# Patient Record
Sex: Female | Born: 1965 | Race: Black or African American | Hispanic: No | Marital: Married | Smoking: Never smoker
Health system: Southern US, Community
[De-identification: ages and names within clinical notes are randomized; demographics above are authoritative.]

---

## 2017-03-08 ENCOUNTER — Encounter (HOSPITAL_COMMUNITY): Payer: Self-pay | Admitting: Emergency Medicine

## 2017-03-08 ENCOUNTER — Emergency Department (HOSPITAL_COMMUNITY)
Admission: EM | Admit: 2017-03-08 | Discharge: 2017-03-08 | Disposition: A | Discharge status: Home / Self Care | Payer: BLUE CROSS/BLUE SHIELD | Attending: Emergency Medicine | Admitting: Emergency Medicine

## 2017-03-08 ENCOUNTER — Emergency Department (HOSPITAL_COMMUNITY): Payer: BLUE CROSS/BLUE SHIELD

## 2017-03-08 DIAGNOSIS — R519 Headache, unspecified: Secondary | ICD-10-CM

## 2017-03-08 DIAGNOSIS — G4489 Other headache syndrome: Secondary | ICD-10-CM | POA: Insufficient documentation

## 2017-03-08 DIAGNOSIS — R51 Headache: Secondary | ICD-10-CM

## 2017-03-08 LAB — CBC WITH DIFFERENTIAL/PLATELET
BASOS PCT: 0 %
Basophils Absolute: 0 10*3/uL (ref 0.0–0.1)
EOS ABS: 0.1 10*3/uL (ref 0.0–0.7)
Eosinophils Relative: 1 %
HCT: 36.9 % (ref 36.0–46.0)
Hemoglobin: 11.8 g/dL — ABNORMAL LOW (ref 12.0–15.0)
Lymphocytes Relative: 30 %
Lymphs Abs: 2.4 10*3/uL (ref 0.7–4.0)
MCH: 27.1 pg (ref 26.0–34.0)
MCHC: 32 g/dL (ref 30.0–36.0)
MCV: 84.8 fL (ref 78.0–100.0)
MONOS PCT: 9 %
Monocytes Absolute: 0.7 10*3/uL (ref 0.1–1.0)
Neutro Abs: 4.7 10*3/uL (ref 1.7–7.7)
Neutrophils Relative %: 60 %
Platelets: 332 10*3/uL (ref 150–400)
RBC: 4.35 MIL/uL (ref 3.87–5.11)
RDW: 13.3 % (ref 11.5–15.5)
WBC: 7.9 10*3/uL (ref 4.0–10.5)

## 2017-03-08 LAB — BASIC METABOLIC PANEL
Anion gap: 8 (ref 5–15)
BUN: 12 mg/dL (ref 6–20)
CO2: 30 mmol/L (ref 22–32)
CREATININE: 0.74 mg/dL (ref 0.44–1.00)
Calcium: 9.4 mg/dL (ref 8.9–10.3)
Chloride: 100 mmol/L — ABNORMAL LOW (ref 101–111)
Glucose, Bld: 99 mg/dL (ref 65–99)
Potassium: 3.4 mmol/L — ABNORMAL LOW (ref 3.5–5.1)
SODIUM: 138 mmol/L (ref 135–145)

## 2017-03-08 MED ORDER — SODIUM CHLORIDE 0.9 % IV SOLN
INTRAVENOUS | Status: DC
  Administered 2017-03-08: 17:00:00 via INTRAVENOUS

## 2017-03-08 MED ORDER — DIPHENHYDRAMINE HCL 50 MG/ML IJ SOLN
25.0000 mg | Freq: Once | INTRAMUSCULAR | Status: AC
  Administered 2017-03-08: 25 mg via INTRAVENOUS
  Filled 2017-03-08: qty 1

## 2017-03-08 MED ORDER — DEXAMETHASONE SODIUM PHOSPHATE 4 MG/ML IJ SOLN
10.0000 mg | Freq: Once | INTRAMUSCULAR | Status: AC
  Administered 2017-03-08: 10 mg via INTRAVENOUS
  Filled 2017-03-08: qty 3

## 2017-03-08 MED ORDER — SODIUM CHLORIDE 0.9 % IV BOLUS (SEPSIS)
1000.0000 mL | Freq: Once | INTRAVENOUS | Status: AC
  Administered 2017-03-08: 1000 mL via INTRAVENOUS

## 2017-03-08 MED ORDER — PROMETHAZINE HCL 25 MG/ML IJ SOLN
12.5000 mg | Freq: Once | INTRAMUSCULAR | Status: AC
  Administered 2017-03-08: 12.5 mg via INTRAVENOUS
  Filled 2017-03-08: qty 1

## 2017-03-08 MED ORDER — HYDROMORPHONE HCL 1 MG/ML IJ SOLN
1.0000 mg | Freq: Once | INTRAMUSCULAR | Status: AC
  Administered 2017-03-08: 1 mg via INTRAVENOUS
  Filled 2017-03-08: qty 1

## 2017-03-08 NOTE — ED Triage Notes (Addendum)
Pt states today around 1100 she experienced a headache, nausea and dizziness upon standing, per EMS CBG of 106, v/s: 151/95, P-70, O2-98% upon arrival

## 2017-03-08 NOTE — Discharge Instructions (Signed)
Rest today and tomorrow. Would expect headache to be improved significantly or gone by tomorrow. Work note provided. Return for any new or worse symptoms. CT of the head without any acute findings.

## 2017-03-08 NOTE — ED Provider Notes (Signed)
[facility] Provider Note   CSN: [id number] Arrival date & time: 03/08/17  1444     History   Chief Complaint Chief Complaint  Patient presents with  . Headache    HPI Robyn Thomas is a 51 y.o. female.   Patient with acute onset of frontal headache over the for head area 11:00 this morning. Associated with some dizziness but no vertigo some nausea but no vomiting. Patient does not have a history of migraines but does get headaches occasionally. Past medical history noncontributory. Patient denies any neck stiffness fevers any visual changes or photophobia.      No past medical history on file.  There are no active problems to display for this patient.   No past surgical history on file.  OB History    No data available       Home Medications    Prior to Admission medications   Medication Sig Start Date End Date Taking? Authorizing Provider  albuterol (PROVENTIL HFA;VENTOLIN HFA) 108 (90 Base) MCG/ACT inhaler Inhale 1-2 puffs into the lungs every 6 (six) hours as needed for wheezing or shortness of breath.   Yes [provider]  predniSONE (DELTASONE) 10 MG tablet Take 10 mg by mouth daily with breakfast. 03/05/17  Yes [provider]  traMADol-acetaminophen (ULTRACET) 37.5-325 MG tablet Take 1 tablet by mouth daily as needed for pain. 02/27/17  Yes [provider]    Family History No family history on file.  Social History Social History  Substance Use Topics  . Smoking status: Never Smoker  . Smokeless tobacco: Never Used  . Alcohol use No     Allergies   Ibuprofen   Review of Systems Review of Systems  Constitutional: Negative for fever.  HENT: Negative for congestion.   Eyes: Negative for photophobia.  Respiratory: Negative for shortness of breath.   Cardiovascular: Negative for chest pain.  Gastrointestinal: Positive for nausea. Negative for abdominal pain.  Genitourinary: Negative for dysuria.    Musculoskeletal: Negative for neck pain.  Skin: Negative for rash.  Neurological: Positive for dizziness and headaches.  Hematological: Does not bruise/bleed easily.  Psychiatric/Behavioral: Negative for confusion.     Physical Exam Updated Vital Signs BP 130/87 (BP Location: Right Arm)   Pulse (!) 57   Temp 97.7 F (36.5 C) (Oral)   Resp 18   Ht 1.702 m (5\' 7" )   Wt 89.8 kg (198 lb)   SpO2 98%   BMI 31.01 kg/m   Physical Exam  Constitutional: She is oriented to person, place, and time. She appears well-developed and well-nourished. No distress.  HENT:  Head: Normocephalic and atraumatic.  Eyes: Conjunctivae and EOM are normal. Pupils are equal, round, and reactive to light.  Neck: Normal range of motion.  Cardiovascular: Normal rate and normal heart sounds.   Pulmonary/Chest: Effort normal and breath sounds normal.  Abdominal: Soft. Bowel sounds are normal.  Musculoskeletal: Normal range of motion. She exhibits no edema.  Neurological: She is alert and oriented to person, place, and time. No cranial nerve deficit or sensory deficit. She exhibits normal muscle tone. Coordination normal.  Skin: Skin is warm.  Nursing note and vitals reviewed.    ED Treatments / Results  Labs (all labs ordered are listed, but only abnormal results are displayed) Labs Reviewed  CBC WITH DIFFERENTIAL/PLATELET - Abnormal; Notable for the following:       Result Value   Hemoglobin 11.8 (*)    All other components within normal limits  BASIC  METABOLIC PANEL - Abnormal; Notable for the following:    Potassium 3.4 (*)    Chloride 100 (*)    All other components within normal limits    EKG  EKG Interpretation None       Radiology Ct Head Wo Contrast  Result Date: 03/08/2017 CLINICAL DATA:  Sudden onset of headache earlier today. Nausea and dizziness on standing. EXAM: CT HEAD WITHOUT CONTRAST TECHNIQUE: Contiguous axial images were obtained from the base of the skull through the  vertex without intravenous contrast. COMPARISON:  None. FINDINGS: Brain: No evidence of acute infarction, hemorrhage, hydrocephalus, extra-axial collection or mass lesion/mass effect. Normal cerebral volume. No white matter disease. Vascular: No hyperdense vessel or unexpected calcification. Skull: Normal. Negative for fracture or focal lesion. Sinuses/Orbits: No acute finding. Other: None. IMPRESSION: Negative exam. Electronically Signed   By: Elsie StainJohn T Curnes M.D.   On: 03/08/2017 17:43    Procedures Procedures (including critical care time)  Medications Ordered in ED Medications  0.9 %  sodium chloride infusion ( Intravenous New Bag/Given 03/08/17 1707)  sodium chloride 0.9 % bolus 1,000 mL (0 mLs Intravenous Stopped 03/08/17 1837)  HYDROmorphone (DILAUDID) injection 1 mg (1 mg Intravenous Given 03/08/17 1701)  dexamethasone (DECADRON) injection 10 mg (10 mg Intravenous Given 03/08/17 1701)  promethazine (PHENERGAN) injection 12.5 mg (12.5 mg Intravenous Given 03/08/17 1701)  diphenhydrAMINE (BENADRYL) injection 25 mg (25 mg Intravenous Given 03/08/17 1701)     Initial Impression / Assessment and Plan / ED Course  I have reviewed the triage vital signs and the nursing notes.  Pertinent labs & imaging results that were available during my care of the patient were reviewed by me and considered in my medical decision making (see chart for details).      Head CT was negative for any acute findings. Patient treated with migraine cocktail to include Phenergan and Benadryl and Decadron and hydromorphone. The headache is improving but not resolved patient feels much better. Patient nontoxic no acute distress.  Final Clinical Impressions(s) / ED Diagnoses   Final diagnoses:  Headache disorder    New Prescriptions New Prescriptions   No medications on file     Vanetta MuldersZackowski, Denson Niccoli, MD 03/08/17 [phone]

## 2018-07-01 IMAGING — CT CT HEAD W/O CM
3 series · 16 of 47 positions shown, 19 images · non-contrast
Comparison: None.

CLINICAL DATA: Sudden onset of headache earlier today. Nausea and
dizziness on standing.

EXAM:
CT HEAD WITHOUT CONTRAST
TECHNIQUE: Contiguous axial images were obtained from the base of the skull
through the vertex without intravenous contrast.

[Series 2: head wo · axial · 0.41mm/px · z∈[+1371,+1496]mm · 10 of 30 slices shown, 13 images]
[im 3/30  brain]
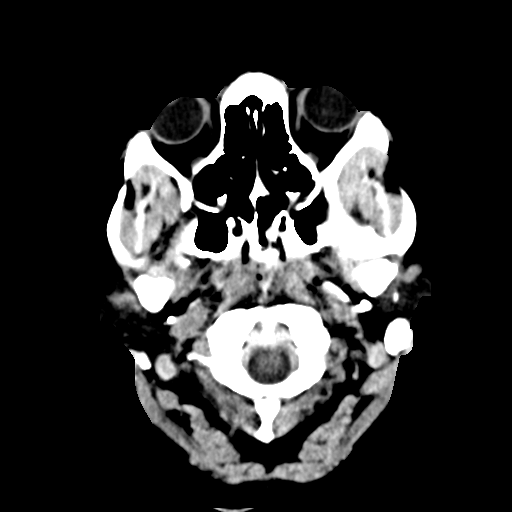
[im 3/30  bone]
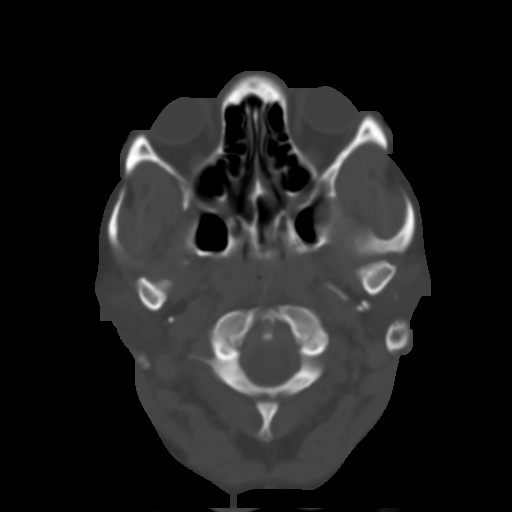
[im 6/30  brain]
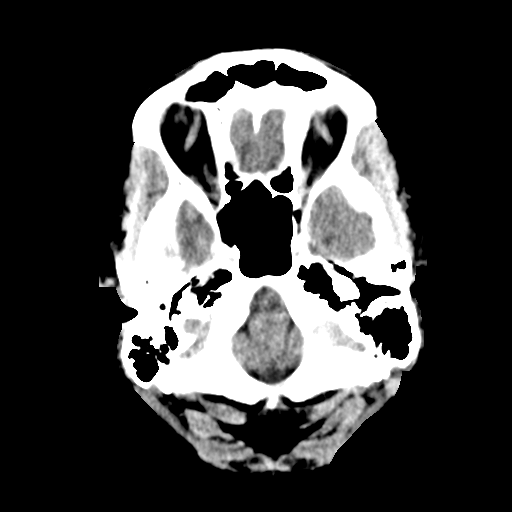
[im 9/30  brain]
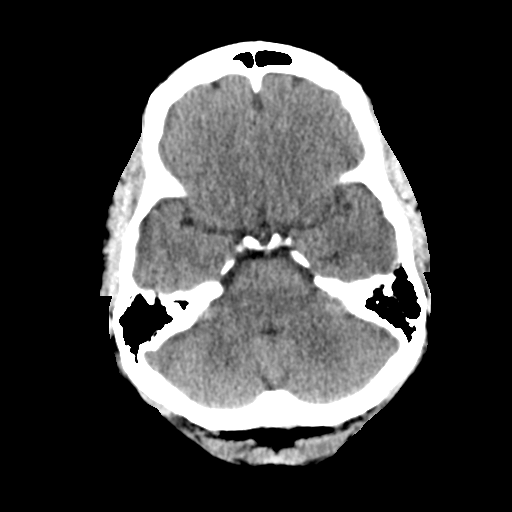
[im 11/30  brain]
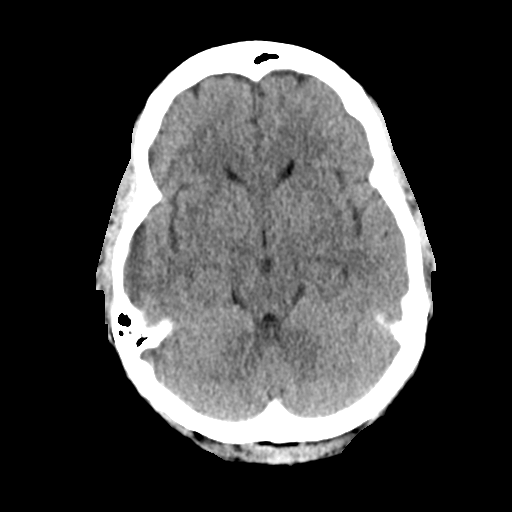
[im 14/30  brain]
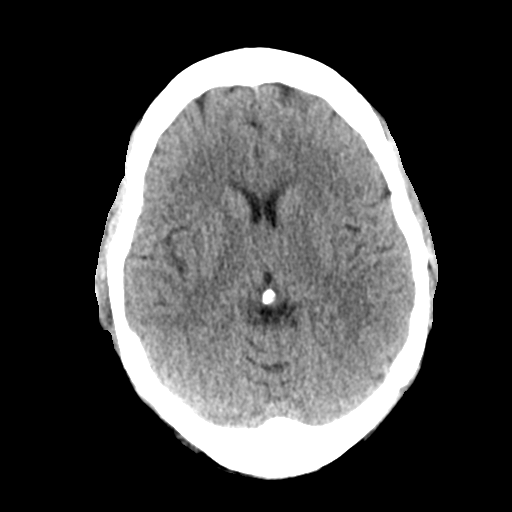
[im 14/30  bone]
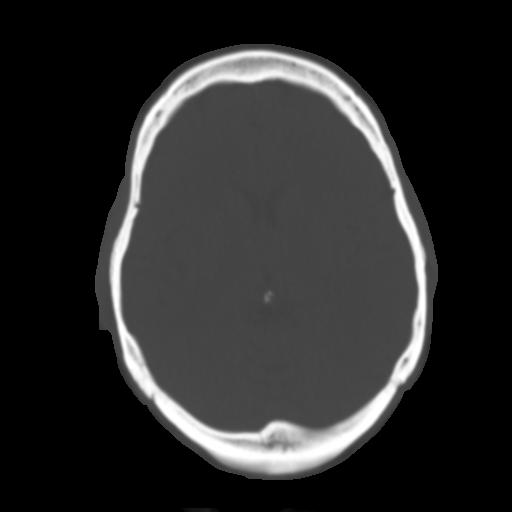
[im 17/30  brain]
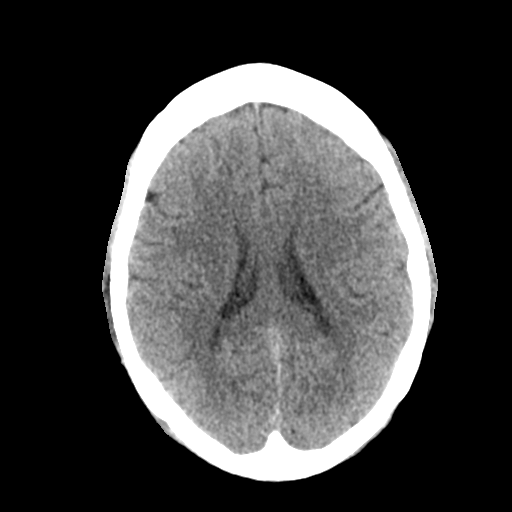
[im 20/30  brain]
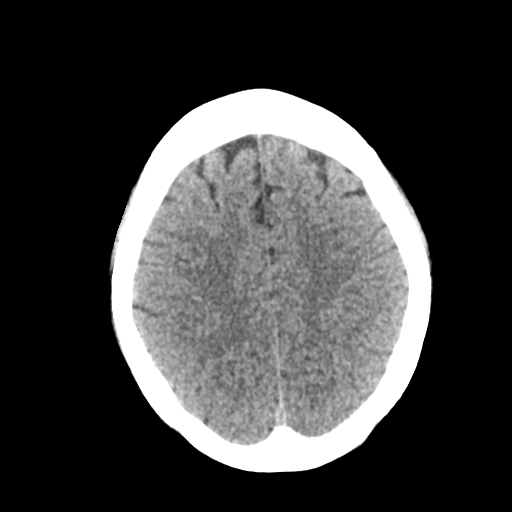
[im 23/30  brain]
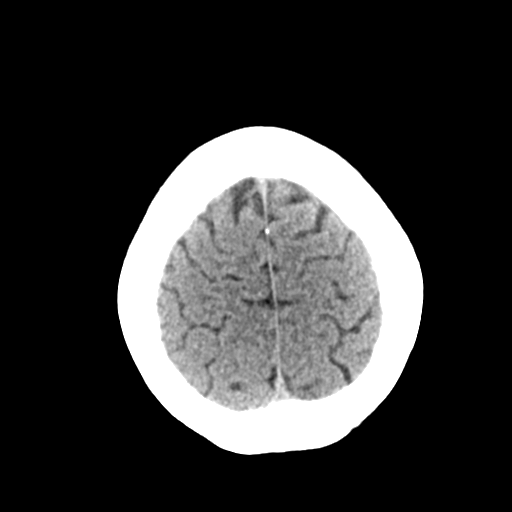
[im 25/30  brain]
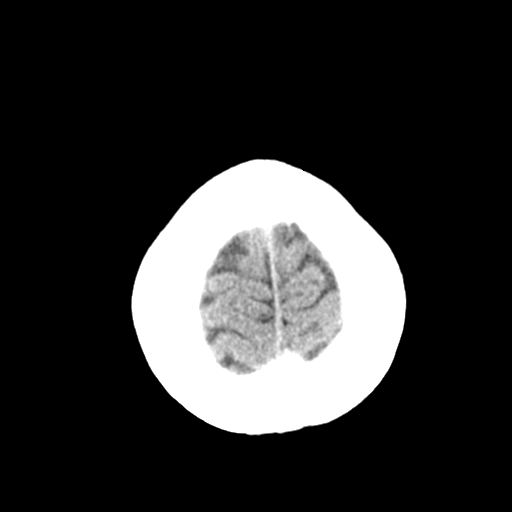
[im 25/30  bone]
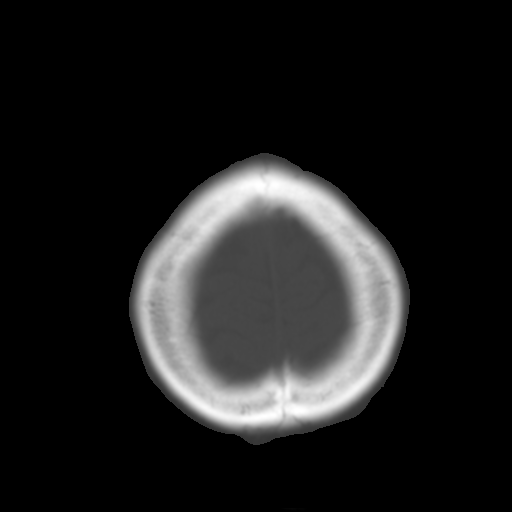
[im 28/30  brain]
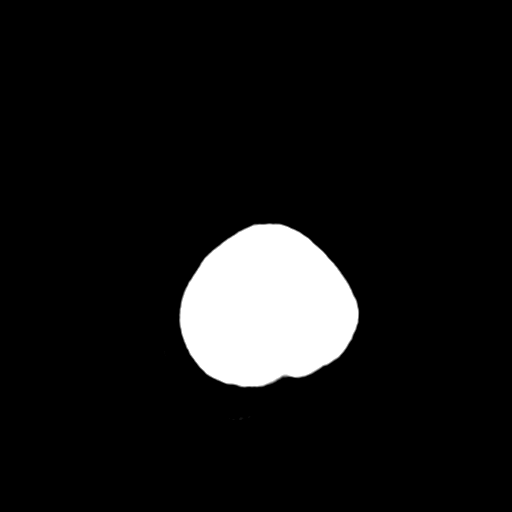

[Series 4: coronal soft tissue · coronal · 0.31mm/px · 3 of 67 slices shown]
[im 23/67  brain]
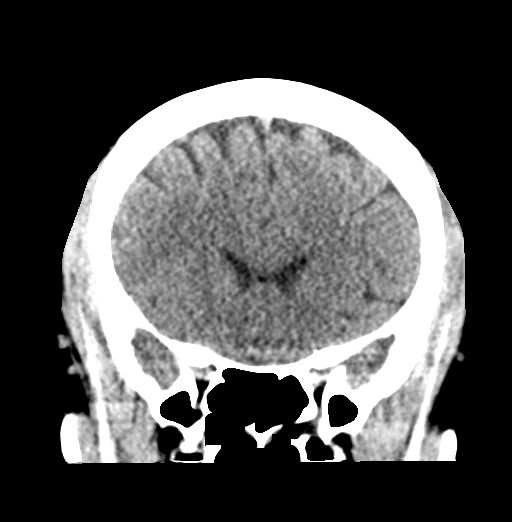
[im 30/67  brain]
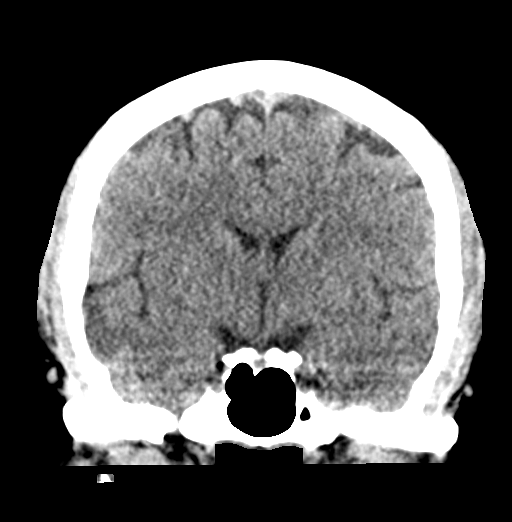
[im 37/67  brain]
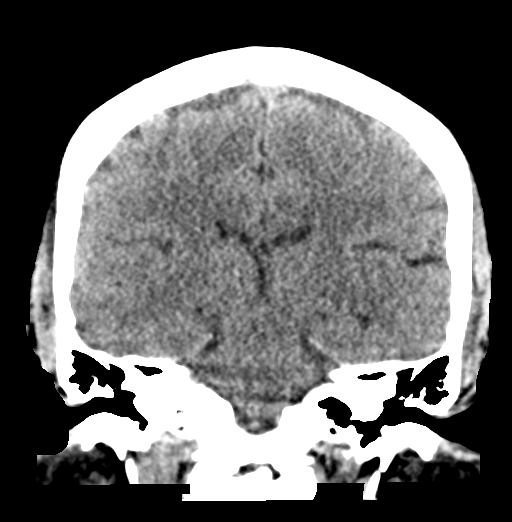

[Series 5: sagittal soft tissue · sagittal · 0.30mm/px · 3 of 53 slices shown]
[im 18/53  brain]
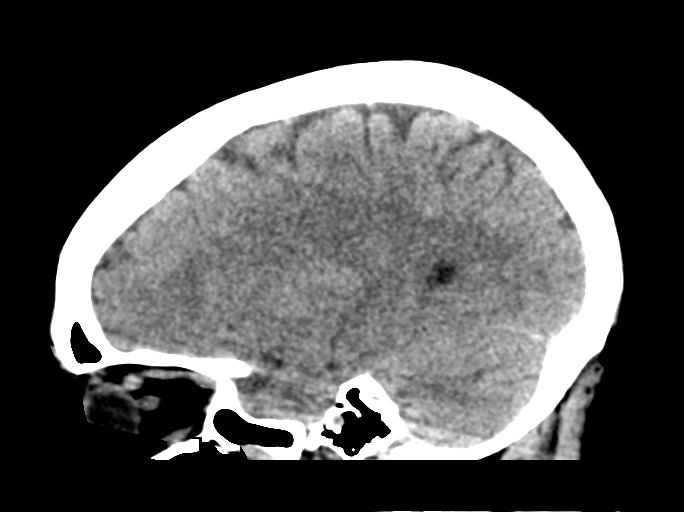
[im 27/53  brain]
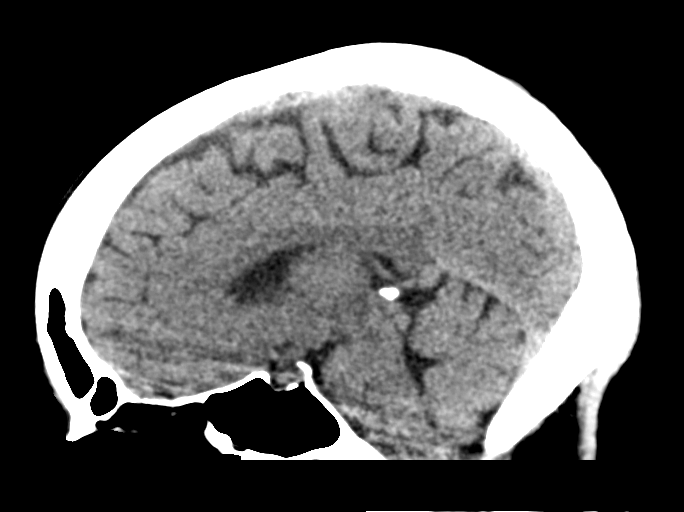
[im 35/53  brain]
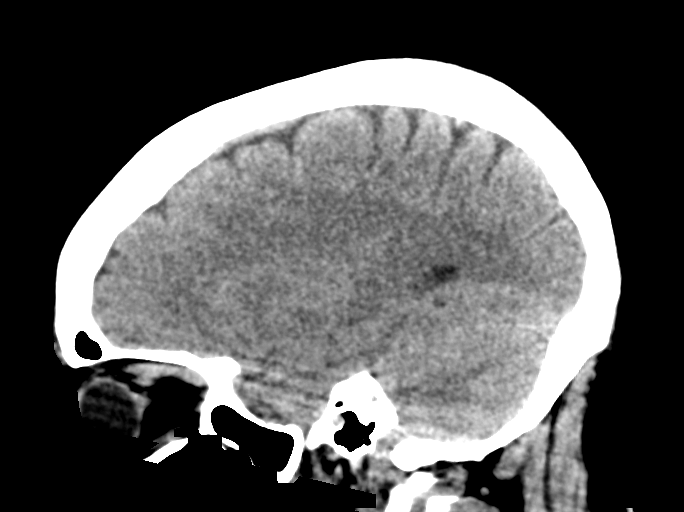

[16 of 47 positions shown; findings below may reference images not displayed]

FINDINGS: Brain: No evidence of acute infarction, hemorrhage, hydrocephalus,
extra-axial collection or mass lesion/mass effect. Normal cerebral
volume. No white matter disease.

Vascular: No hyperdense vessel or unexpected calcification.

Skull: Normal. Negative for fracture or focal lesion.

Sinuses/Orbits: No acute finding.

Other: None.
IMPRESSION: Negative exam.
